# Patient Record
Sex: Female | Born: 1995 | Race: White | Hispanic: No | Marital: Single | Smoking: Never smoker
Health system: Southern US, Community
[De-identification: ages and names within clinical notes are randomized; demographics above are authoritative.]

---

## 2015-07-26 ENCOUNTER — Ambulatory Visit (INDEPENDENT_AMBULATORY_CARE_PROVIDER_SITE_OTHER): Payer: Managed Care, Other (non HMO) | Admitting: Family Medicine

## 2015-07-26 VITALS — BP 102/70 | HR 82 | Temp 98.6°F | Resp 16 | Ht 65.5 in | Wt 152.4 lb

## 2015-07-26 DIAGNOSIS — R05 Cough: Secondary | ICD-10-CM | POA: Diagnosis not present

## 2015-07-26 DIAGNOSIS — J029 Acute pharyngitis, unspecified: Secondary | ICD-10-CM

## 2015-07-26 DIAGNOSIS — R059 Cough, unspecified: Secondary | ICD-10-CM

## 2015-07-26 DIAGNOSIS — J208 Acute bronchitis due to other specified organisms: Secondary | ICD-10-CM

## 2015-07-26 LAB — POCT CBC
Granulocyte percent: 84.4 %G — AB (ref 37–80)
HCT, POC: 38.4 % (ref 37.7–47.9)
Hemoglobin: 13 g/dL (ref 12.2–16.2)
LYMPH, POC: 1.3 (ref 0.6–3.4)
MCH, POC: 29.5 pg (ref 27–31.2)
MCHC: 33.8 g/dL (ref 31.8–35.4)
MCV: 87.1 fL (ref 80–97)
MID (CBC): 0.7 (ref 0–0.9)
MPV: 6.8 fL (ref 0–99.8)
PLATELET COUNT, POC: 321 10*3/uL (ref 142–424)
POC Granulocyte: 11.1 — AB (ref 2–6.9)
POC LYMPH %: 10.1 % (ref 10–50)
POC MID %: 5.5 %M (ref 0–12)
RBC: 4.41 M/uL (ref 4.04–5.48)
RDW, POC: 13.1 %
WBC: 13.1 10*3/uL — AB (ref 4.6–10.2)

## 2015-07-26 LAB — POCT RAPID STREP A (OFFICE): Rapid Strep A Screen: NEGATIVE

## 2015-07-26 MED ORDER — AZITHROMYCIN 250 MG PO TABS: ORAL_TABLET | ORAL | Status: DC

## 2015-07-26 NOTE — Progress Notes (Signed)
[facility] [address], [place] [id number] [phone]  Date:  07/26/2015   Name:  Carla EkeJennifer Fatica   DOB:  [birth year]   MRN:  [id number]  PCP:  No primary care provider on file.    Chief Complaint: Nasal Congestion; Headache; swollen lymph node; and Sore Throat   History of Present Illness:  Carla Rodriguez is a 19 y.o. very pleasant female patient who presents with the following:  She notes onset of illness 2 days ago.  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   History reviewed. No pertinent past medical history.  History reviewed. No pertinent past surgical history.  Social History  Substance Use Topics  . Smoking status: Never Smoker   . Smokeless tobacco: None  . Alcohol Use: No    History reviewed. No pertinent family history.  No Known Allergies  Medication list has been reviewed and updated.  No current outpatient prescriptions on file prior to visit.   No current facility-administered medications on file prior to visit.    Review of Systems:  As per HPI- otherwise negative.   Physical Examination: Filed Vitals:   07/26/15 1247  BP: 102/70  Pulse: 82  Temp: 98.6 F (37 C)  Resp: 16   Filed Vitals:   07/26/15 1247  Height: 5' 5.5" (1.664 m)  Weight: 152 lb 6.4 oz (69.128 kg)   Body mass index is 24.97 kg/(m^2). Ideal Body Weight: Weight in (lb) to have BMI = 25: 152.2  GEN: WDWN, NAD, Non-toxic, A & O x 3, looks well HEENT: Atraumatic, Normocephalic. Neck supple. No masses.  Anterior cervical  LAD.  Bilateral TM wnl, oropharynx normal.  PEERL,EOMI.   Ears and Nose: No external deformity. CV: RRR, No M/G/R. No JVD. No thrill. No extra heart sounds. PULM: CTA B, no wheezes, crackles, rhonchi. No retractions. No resp. distress. No accessory muscle use. ABD: S, NT, ND, +BS. No rebound. No HSM. EXTR: No c/c/e NEURO Normal gait.  PSYCH: Normally interactive. Conversant. Not depressed or anxious appearing.  Calm demeanor.    Assessment and Plan: Acute bronchitis due to other specified organisms - Plan: azithromycin (ZITHROMAX) 250 MG tablet  Cough - Plan: POCT CBC  Acute pharyngitis, unspecified etiology - Plan: POCT CBC, POCT rapid strep A  Treat for bronchitis with leukocytosis with azithromcyin, supportive care She will let us know if not feeling better soon- Sooner if worse.   Discussed supportive care  Signed Abbe AmsterdamJessica Thadius Smisek, MD

## 2015-07-26 NOTE — Patient Instructions (Signed)
Use the azithromycin as directed for bronchitis- Use ibuprofen and/ or tylenol as needed for pain, fever and aches Rest and drink plenty of fluids Let me know if you do not feel better in the next few days

## 2016-07-26 ENCOUNTER — Emergency Department (HOSPITAL_COMMUNITY): Payer: Managed Care, Other (non HMO)

## 2016-07-26 ENCOUNTER — Emergency Department (HOSPITAL_COMMUNITY)
Admission: EM | Admit: 2016-07-26 | Discharge: 2016-07-26 | Disposition: A | Discharge status: Home / Self Care | Payer: Managed Care, Other (non HMO) | Attending: Emergency Medicine | Admitting: Emergency Medicine

## 2016-07-26 DIAGNOSIS — Z79899 Other long term (current) drug therapy: Secondary | ICD-10-CM | POA: Insufficient documentation

## 2016-07-26 DIAGNOSIS — R059 Cough, unspecified: Secondary | ICD-10-CM

## 2016-07-26 DIAGNOSIS — R042 Hemoptysis: Secondary | ICD-10-CM | POA: Diagnosis present

## 2016-07-26 DIAGNOSIS — R05 Cough: Secondary | ICD-10-CM | POA: Insufficient documentation

## 2016-07-26 LAB — URINALYSIS, ROUTINE W REFLEX MICROSCOPIC
Bilirubin Urine: NEGATIVE
GLUCOSE, UA: NEGATIVE mg/dL
HGB URINE DIPSTICK: NEGATIVE
Ketones, ur: NEGATIVE mg/dL
Nitrite: NEGATIVE
PH: 7 (ref 5.0–8.0)
Protein, ur: NEGATIVE mg/dL
SPECIFIC GRAVITY, URINE: 1.02 (ref 1.005–1.030)

## 2016-07-26 LAB — BASIC METABOLIC PANEL
ANION GAP: 8 (ref 5–15)
BUN: 9 mg/dL (ref 6–20)
CHLORIDE: 105 mmol/L (ref 101–111)
CO2: 24 mmol/L (ref 22–32)
CREATININE: 0.62 mg/dL (ref 0.44–1.00)
Calcium: 9.6 mg/dL (ref 8.9–10.3)
GFR calc non Af Amer: >60 mL/min (ref >60)
Glucose, Bld: 93 mg/dL (ref 65–99)
POTASSIUM: 4 mmol/L (ref 3.5–5.1)
Sodium: 137 mmol/L (ref 135–145)

## 2016-07-26 LAB — CBC
HEMATOCRIT: 41.2 % (ref 36.0–46.0)
HEMOGLOBIN: 13.7 g/dL (ref 12.0–15.0)
MCH: 29.6 pg (ref 26.0–34.0)
MCHC: 33.3 g/dL (ref 30.0–36.0)
MCV: 89 fL (ref 78.0–100.0)
Platelets: 394 10*3/uL (ref 150–400)
RBC: 4.63 MIL/uL (ref 3.87–5.11)
RDW: 13.2 % (ref 11.5–15.5)
WBC: 8.7 10*3/uL (ref 4.0–10.5)

## 2016-07-26 LAB — URINE MICROSCOPIC-ADD ON

## 2016-07-26 LAB — CBG MONITORING, ED: Glucose-Capillary: 88 mg/dL (ref 65–99)

## 2016-07-26 LAB — D-DIMER, QUANTITATIVE: D-Dimer, Quant: <0.27 ug/mL-FEU (ref 0.00–0.50)

## 2016-07-26 MED ORDER — ALBUTEROL SULFATE HFA 108 (90 BASE) MCG/ACT IN AERS
2.0000 | INHALATION_SPRAY | RESPIRATORY_TRACT | Status: DC | PRN
  Filled 2016-07-26: qty 6.7

## 2016-07-26 MED ORDER — AZITHROMYCIN 250 MG PO TABS: ORAL_TABLET | ORAL | 0 refills | Status: AC

## 2016-07-26 MED ORDER — PREDNISONE 20 MG PO TABS: 40.0000 mg | ORAL_TABLET | Freq: Every day | ORAL | 0 refills | Status: AC

## 2016-07-26 NOTE — ED Triage Notes (Signed)
Pt presents c/o productive cough w/ blood tinged sputum. Pt states [facility]. Pt reports unwitnessed syncope last night. Pt A+OX4, speaking in complete sentences.

## 2016-07-26 NOTE — Discharge Instructions (Signed)
Please read and follow all provided instructions.  Your diagnoses today include:  1. Cough     Tests performed today include:  Chest x-ray - does not show any pneumonia  Blood counts and electrolytes  Test for blood clot - negative  Vital signs. See below for your results today.   Medications prescribed:   Azithromycin - antibiotic for respiratory infection  You have been prescribed an antibiotic medicine: take the entire course of medicine even if you are feeling better. Stopping early can cause the antibiotic not to work.   Albuterol inhaler - medication that opens up your airway  Use inhaler as follows: 1-2 puffs with spacer every 4 hours as needed for wheezing, cough, or shortness of breath.    Prednisone - steroid medicine   It is best to take this medication in the morning to prevent sleeping problems. If you are diabetic, monitor your blood sugar closely and stop taking Prednisone if blood sugar is over 300. Take with food to prevent stomach upset.   Take any prescribed medications only as directed.  Home care instructions:  Follow any educational materials contained in this packet.  Follow-up instructions: Please follow-up with your primary care provider in the next 3 days for further evaluation of your symptoms and a recheck if you are not feeling better.   Return instructions:   Please return to the Emergency Department if you experience worsening symptoms.  Please return with worsening wheezing, shortness of breath, or difficulty breathing.  Return with persistent fever above 101F.   Please return if you have any other emergent concerns.  Additional Information:  Your vital signs today were: BP 127/90 (BP Location: Right Arm)    Pulse 76    Temp 99.2 F (37.3 C) (Oral)    Resp 18    Ht 5\' 5"  (1.651 m)    Wt 80.3 kg    LMP 06/22/2016    SpO2 100%    BMI 29.47 kg/m  If your blood pressure (BP) was elevated above 135/85 this visit, please have this  repeated by your doctor within one month. --------------

## 2016-07-26 NOTE — ED Provider Notes (Signed)
[facility]-EMERGENCY DEPT Provider Note   CSN: [id number] Arrival date & time: 07/26/16  1051     History   Chief Complaint Chief Complaint  Patient presents with  . Hemoptysis    HPI Carla Rodriguez is a 20 y.o. female.  Patient presents with complaint of cough for approximately 5 weeks. Patient had nasal congestion onset however this improved. She denies a persistent cough with production of sputum. She was seen by her primary care initially who gave her supportive care. Later which she was seen by the doctor at her [facility] [employer], had an x-ray and was treated for pneumonia and bronchitis. Patient states that she took a week of doxycycline and finished this but the symptoms have not improved. Patient had blood-tinged sputum this morning which is new. She complains of pain across her lower chest. Sometimes she feels short of breath when lying flat. She has had some muscle cramps in her legs and mild swelling bilaterally. Patient is on oral birth control. She is not a smoker. Patient denies other risk factors for pulmonary embolism including: unilateral leg swelling, history of DVT/PE/other blood clots,  recent immobilizations, recent surgery, recent travel (>4hr segment), malignancy, hemoptysis. Denies allergy symptoms, GERD symptoms, h/o asthma. She has some wheezing at times. The onset of this condition was acute. The course is persistent. Aggravating factors: none. Alleviating factors: none.         No past medical history on file.  There are no active problems to display for this patient.   No past surgical history on file.  OB History    No data available       Home Medications    Prior to Admission medications   Medication Sig Start Date End Date Taking? Authorizing Provider  acetaminophen (TYLENOL) 325 MG tablet Take 650 mg by mouth every 6 (six) hours as needed for moderate pain.   Yes Historical Provider, MD  amphetamine-dextroamphetamine (ADDERALL) 10 MG tablet Take  15 mg by mouth 2 (two) times daily as needed. Pt only takes when she goes to school.   Yes Historical Provider, MD  diphenhydrAMINE (BENADRYL) 25 MG tablet Take 25 mg by mouth every 6 (six) hours as needed for allergies.   Yes Historical Provider, MD  Norethindrone Acetate-Ethinyl Estrad-FE (LOMEDIA 24 FE) 1-20 MG-MCG(24) tablet Take 1 tablet by mouth daily.   Yes Historical Provider, MD  azithromycin (ZITHROMAX Z-PAK) 250 MG tablet Take two tablets PO on day 1 and one tablet PO days 2-5 07/26/16   Renne CriglerJoshua Carleah Yablonski, PA-C  predniSONE (DELTASONE) 20 MG tablet Take 2 tablets (40 mg total) by mouth daily. 07/26/16   Renne CriglerJoshua Khing Belcher, PA-C    Family History No family history on file.  Social History Social History  Substance Use Topics  . Smoking status: Never Smoker  . Smokeless tobacco: Not on file  . Alcohol use No     Allergies   Review of patient's allergies indicates no known allergies.   Review of Systems Review of Systems  Constitutional: Negative for fever.  HENT: Negative for rhinorrhea and sore throat.   Eyes: Negative for redness.  Respiratory: Positive for cough, shortness of breath and wheezing.   Cardiovascular: Positive for leg swelling. Negative for chest pain.  Gastrointestinal: Negative for abdominal pain, diarrhea, nausea and vomiting.  Genitourinary: Negative for dysuria.  Musculoskeletal: Negative for myalgias.  Skin: Negative for rash.  Neurological: Negative for headaches.     Physical Exam Updated Vital Signs BP 146/82 (BP Location: Left Arm)  Pulse 83   Temp 99.2 F (37.3 C) (Oral)   Resp 17   Ht 5\' 5"  (1.651 m)   Wt 80.3 kg   LMP 06/22/2016   SpO2 100%   BMI 29.47 kg/m   Physical Exam  Constitutional: She appears well-developed and well-nourished.  HENT:  Head: Normocephalic and atraumatic.  Right Ear: Tympanic membrane normal.  Left Ear: Tympanic membrane normal.  Nose: Nose normal. No mucosal edema.  Mouth/Throat: Uvula is midline and  oropharynx is clear and moist.  Eyes: Conjunctivae are normal. Right eye exhibits no discharge. Left eye exhibits no discharge.  Neck: Normal range of motion. Neck supple.  Cardiovascular: Normal rate, regular rhythm and normal heart sounds.   No murmur heard. Pulmonary/Chest: Effort normal and breath sounds normal. No respiratory distress. She has no wheezes. She has no rales.  Abdominal: Soft. There is no tenderness.  Musculoskeletal: She exhibits no edema.  No appreciable lower extremity edema on exam.   Neurological: She is alert.  Skin: Skin is warm and dry.  Psychiatric: She has a normal mood and affect.  Nursing note and vitals reviewed.    ED Treatments / Results  Labs (all labs ordered are listed, but only abnormal results are displayed) Labs Reviewed  URINALYSIS, ROUTINE W REFLEX MICROSCOPIC (NOT AT [facility]) - Abnormal; Notable for the following:       Result Value   APPearance CLOUDY (*)    Leukocytes, UA MODERATE (*)    All other components within normal limits  URINE MICROSCOPIC-ADD ON - Abnormal; Notable for the following:    Squamous Epithelial / LPF 6-30 (*)    Bacteria, UA RARE (*)    All other components within normal limits  BASIC METABOLIC PANEL  CBC  D-DIMER, QUANTITATIVE (NOT AT [facility])  CBG MONITORING, ED    Radiology Dg Chest 2 View  Result Date: 07/26/2016 CLINICAL DATA:  Nonproductive cough EXAM: CHEST  2 VIEW COMPARISON:  None. FINDINGS: The heart size and mediastinal contours are within normal limits. Both lungs are clear. The visualized skeletal structures are unremarkable. IMPRESSION: No active cardiopulmonary disease. Electronically Signed   By: Alcide Clever M.D.   On: 07/26/2016 13:42    Procedures Procedures (including critical care time)  Medications Ordered in ED Medications  albuterol (PROVENTIL HFA;VENTOLIN HFA) 108 (90 Base) MCG/ACT inhaler 2 puff (not administered)     Initial Impression / Assessment and Plan / ED Course  I have  reviewed the triage vital signs and the nursing notes.  Pertinent labs & imaging results that were available during my care of the patient were reviewed by me and considered in my medical decision making (see chart for details).  Clinical Course   Patient seen and examined. Reviewed work-up to this point. Discussed d-dimer rule out PE. Patient agrees to proceed.  Vital signs reviewed and are as follows: BP 146/82 (BP Location: Left Arm)   Pulse 83   Temp 99.2 F (37.3 C) (Oral)   Resp 17   Ht 5\' 5"  (1.651 m)   Wt 80.3 kg   LMP 06/22/2016   SpO2 100%   BMI 29.47 kg/m   D-dimer is negative. Will discharge to home with albuterol inhaler, azithromycin, prednisone.  Encouraged patient to follow-up with her primary care physician for recheck. Return to the emergency department with worsening symptoms, trouble breathing, fever, new symptoms or other concerns.    Final Clinical Impressions(s) / ED Diagnoses   Final diagnoses:  Cough   Patient with persistent  cough, normal x-ray, negative d-dimer. Will continue to treat supportively.  New Prescriptions Discharge Medication List as of 07/26/2016  2:56 PM    START taking these medications   Details  predniSONE (DELTASONE) 20 MG tablet Take 2 tablets (40 mg total) by mouth daily., Starting Sun 07/26/2016, Print         Renne Crigler, PA-C 07/26/16 1525    Nelva Nay, MD 08/01/16 [phone]

## 2017-08-01 ENCOUNTER — Emergency Department (HOSPITAL_COMMUNITY): Payer: Managed Care, Other (non HMO)

## 2017-08-01 ENCOUNTER — Encounter (HOSPITAL_COMMUNITY): Payer: Self-pay | Admitting: Nurse Practitioner

## 2017-08-01 ENCOUNTER — Emergency Department (HOSPITAL_COMMUNITY)
Admission: EM | Admit: 2017-08-01 | Discharge: 2017-08-02 | Disposition: A | Discharge status: Home / Self Care | Payer: Managed Care, Other (non HMO) | Attending: Emergency Medicine | Admitting: Emergency Medicine

## 2017-08-01 DIAGNOSIS — R350 Frequency of micturition: Secondary | ICD-10-CM | POA: Diagnosis present

## 2017-08-01 DIAGNOSIS — Z79899 Other long term (current) drug therapy: Secondary | ICD-10-CM | POA: Diagnosis not present

## 2017-08-01 DIAGNOSIS — M545 Low back pain, unspecified: Secondary | ICD-10-CM

## 2017-08-01 LAB — BASIC METABOLIC PANEL
ANION GAP: 10 (ref 5–15)
BUN: 16 mg/dL (ref 6–20)
CHLORIDE: 105 mmol/L (ref 101–111)
CO2: 25 mmol/L (ref 22–32)
Calcium: 9.8 mg/dL (ref 8.9–10.3)
Creatinine, Ser: 0.63 mg/dL (ref 0.44–1.00)
GFR calc Af Amer: >60 mL/min (ref >60)
GFR calc non Af Amer: >60 mL/min (ref >60)
Glucose, Bld: 100 mg/dL — ABNORMAL HIGH (ref 65–99)
POTASSIUM: 3.8 mmol/L (ref 3.5–5.1)
Sodium: 140 mmol/L (ref 135–145)

## 2017-08-01 LAB — URINALYSIS, ROUTINE W REFLEX MICROSCOPIC
Bilirubin Urine: NEGATIVE
Glucose, UA: NEGATIVE mg/dL
Hgb urine dipstick: NEGATIVE
Ketones, ur: NEGATIVE mg/dL
LEUKOCYTES UA: NEGATIVE
NITRITE: NEGATIVE
PH: 6 (ref 5.0–8.0)
Protein, ur: NEGATIVE mg/dL
SPECIFIC GRAVITY, URINE: 1.003 — AB (ref 1.005–1.030)

## 2017-08-01 LAB — CBC
HEMATOCRIT: 39.7 % (ref 36.0–46.0)
HEMOGLOBIN: 13.6 g/dL (ref 12.0–15.0)
MCH: 30.2 pg (ref 26.0–34.0)
MCHC: 34.3 g/dL (ref 30.0–36.0)
MCV: 88 fL (ref 78.0–100.0)
Platelets: 377 10*3/uL (ref 150–400)
RBC: 4.51 MIL/uL (ref 3.87–5.11)
RDW: 13.2 % (ref 11.5–15.5)
WBC: 9.7 10*3/uL (ref 4.0–10.5)

## 2017-08-01 LAB — WET PREP, GENITAL
Clue Cells Wet Prep HPF POC: NONE SEEN
Sperm: NONE SEEN
Trich, Wet Prep: NONE SEEN
Yeast Wet Prep HPF POC: NONE SEEN

## 2017-08-01 LAB — POC URINE PREG, ED: Preg Test, Ur: NEGATIVE

## 2017-08-01 MED ORDER — CAMPHOR-MENTHOL-METHYL SAL 1.2-5.7-6.3 % EX PTCH: 1.0000 | MEDICATED_PATCH | Freq: Every day | CUTANEOUS | 0 refills | Status: AC

## 2017-08-01 MED ORDER — ACETAMINOPHEN 325 MG PO TABS
650.0000 mg | ORAL_TABLET | Freq: Once | ORAL | Status: AC
  Administered 2017-08-01: 650 mg via ORAL
  Filled 2017-08-01: qty 2

## 2017-08-01 MED ORDER — POLYETHYLENE GLYCOL 3350 17 GM/SCOOP PO POWD: 17.0000 g | Freq: Every day | ORAL | 0 refills | Status: AC

## 2017-08-01 NOTE — Discharge Instructions (Signed)
Your diagnoses today include:  1. Bilateral low back pain without sciatica, unspecified chronicity    About diagnosis. Most episodes of acute low back pain are self-limited. Your exam was reassuring today that the source of your pain is not affecting the spinal cord and nerves that originate in the spinal cord.  It is also reassuring that we have not found pelvic problems for your pain. 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   Tests performed today include: See side panel of your discharge paperwork for testing performed today. Vital signs are listed at the bottom of these instructions.   -Urine testing -Transvaginal ultrasound -Electrolytes and kidney function -Blood counts -Wet prep, gonorrhea, and chlamydia  These are all reassuring.  Medications prescribed:    Take any prescribed medications only as prescribed, and any over the counter medications only as directed on the packaging.  Miralax  SalonPAS for back discomfort.  You are prescribed ibuprofen, a non-steroidal anti-inflammatory agent (NSAID) for pain. You may take 600mg  every 6 hours as needed for pain. If still requiring this medication around the clock for acute pain after 10 days, please see your primary healthcare provider.  You may combine this medication with Tylenol, 650 mg every 6 hours, so you are receiving something for pain every 3 hours.  This is not a long-term medication unless under the care and direction of your primary provider. Taking this medication long-term and not under the supervision of a healthcare provider could increase the risk of stomach ulcers, kidney problems, and cardiovascular problems such as high blood pressure.    Home care instructions:   Low back pain gets worse the longer you stay stationary. Please keep moving and walking as tolerated. There are exercises included in this packet to perform as tolerated for your low back pain.   Apply heat to the areas  that are painful. Avoid twisting or bending your trunk to lift something. Do not lift anything above 25 lbs while recovering from this flare of low back pain.  Please follow any educational materials contained in this packet.   Follow-up instructions: Please follow-up with your primary care provider in this week for further evaluation of your symptoms if they are not completely improved.   Please follow up with endocrinology and gynecology as you previously planned.  Return instructions:  Please return to the Emergency Department if you experience worsening symptoms.  Please return for any fever or chills in the setting of your back pain, weakness in the muscles of the legs, numbness in your legs and feet that is new or changing, numbness in the area where you wipe, retention of your urine, loss of bowel or bladder control, or problems with walking. Please return for any worsening lower abdominal pain, vaginal bleeding or you are soaking through 1 pad an hour for at least 6 hours, blood in stool, fever or chill in the setting of your symptoms. Please return if you have any other emergent concerns.  Additional Information:   Your vital signs today were: BP 134/88 (BP Location: Left Arm)    Pulse 92    Temp 98.4 F (36.9 C) (Oral)    Resp 20    LMP 07/30/2017    SpO2 97%  If your blood pressure (BP) was elevated on multiple readings during this visit above 130 for the top number or above 80 for the bottom number, please have this repeated by your primary care provider within one month. --------------  Thank  you for allowing us to participate in your care today.

## 2017-08-01 NOTE — ED Provider Notes (Signed)
[facility]-EMERGENCY DEPT Provider Note   CSN: [id number] Arrival date & time: 08/01/17  1440     History   Chief Complaint Chief Complaint  Patient presents with  . Urinary Frequency    HPI Carla Rodriguez is a 21 y.o. female.  HPI   Patient is a 21 year old female with an abdominal surgical history of appendectomy presenting for 24 hours of bilateral low back pain and lower abdominal pain.  Patient reports that the pain in her low back is been approximately 8 out of 10 in severity at its worst.  Pain is worst with movement. Patient reports that she had some nausea today and vomited 3 times.  The pain she is having is somewhat consistent with the pain she gets prior to her menstrual cycle with low back pain and pelvic cramping.  Patient denies any fever, chills, diarrhea, vaginal discharge, or abnormal vaginal bleeding.  Patient reports she has had urinary frequency but no pain with urination.  Patient reports last bowel movement was today and normal for patient.  Patient has a history of constipation, however she does not take stool softeners.  She typically has bowel movement 2-3 times a week.  Patient is sexually active with one partner and reports that she has low concern for STI at this time.  Last menstrual period 2 weeks ago.  No saddle anesthesia no loss of bowel or bladder control no lower extremity weakness or numbness, no IVDU.  History reviewed. No pertinent past medical history.  There are no active problems to display for this patient.   History reviewed. No pertinent surgical history.  OB History    No data available       Home Medications    Prior to Admission medications   Medication Sig Start Date End Date Taking? Authorizing Provider  acetaminophen (TYLENOL) 325 MG tablet Take 650 mg by mouth every 6 (six) hours as needed for moderate pain.   Yes [provider]  amphetamine-dextroamphetamine (ADDERALL) 10 MG tablet Take 15  mg by mouth 2 (two) times daily as needed. Pt only takes when she goes to school.   Yes [provider]  Norethindrone Acetate-Ethinyl Estrad-FE (LOMEDIA 24 FE) 1-20 MG-MCG(24) tablet Take 1 tablet by mouth daily.   Yes [provider]  psyllium (REGULOID) 0.52 g capsule Take 0.52 g by mouth daily as needed (bowel).   Yes [provider]  azithromycin (ZITHROMAX Z-PAK) 250 MG tablet Take two tablets PO on day 1 and one tablet PO days 2-5 Patient not taking: Reported on 08/01/2017 07/26/16   Renne Crigler, PA-C  Camphor-Menthol-Methyl Sal 1.2-5.7-6.3 % PTCH Apply 1 patch topically daily. 08/01/17   Aviva Kluver B, PA-C  polyethylene glycol powder (MIRALAX) powder Take 17 g by mouth daily. 08/01/17   Aviva Kluver B, PA-C  predniSONE (DELTASONE) 20 MG tablet Take 2 tablets (40 mg total) by mouth daily. Patient not taking: Reported on 08/01/2017 07/26/16   Renne Crigler, PA-C    Family History No family history on file.  Social History Social History  Substance Use Topics  . Smoking status: Never Smoker  . Smokeless tobacco: Not on file  . Alcohol use No     Allergies   Other   Review of Systems Review of Systems  Constitutional: Negative for chills and fever.  HENT: Negative for congestion and sore throat.   Eyes: Negative for visual disturbance.  Respiratory: Negative for cough, chest tightness and shortness of breath.   Cardiovascular: Negative  for chest pain.  Gastrointestinal: Positive for abdominal pain, nausea and vomiting. Negative for constipation and diarrhea.  Genitourinary: Positive for frequency and pelvic pain. Negative for dysuria, vaginal bleeding and vaginal discharge.  Musculoskeletal: Positive for back pain. Negative for myalgias.  Skin: Negative for rash.  Neurological: Positive for light-headedness and headaches. Negative for dizziness and syncope.     Physical Exam Updated Vital Signs BP 125/76 (BP Location: Left Arm)    Pulse 74   Temp 98.4 F (36.9 C) (Oral)   Resp 20   LMP 07/30/2017   SpO2 97%   Physical Exam  Constitutional: She appears well-developed and well-nourished. No distress.  HENT:  Head: Normocephalic and atraumatic.  Mouth/Throat: Oropharynx is clear and moist.  Eyes: Pupils are equal, round, and reactive to light. Conjunctivae and EOM are normal.  Neck: Normal range of motion. Neck supple.  Cardiovascular: Normal rate, regular rhythm, S1 normal and S2 normal.   No murmur heard. Pulmonary/Chest: Effort normal and breath sounds normal. She has no wheezes. She has no rales.  Abdominal: Soft. She exhibits no distension. There is no tenderness. There is no guarding.  Genitourinary:  Genitourinary Comments: Exam performed with nurse chaperone present.  No external lesions of perineum or vulva.  Discomfort to palpation of right and left adnexa as well as over the uterine fundus, but more on right.  No cervical motion tenderness.  Speculum exam demonstrates no erythema or lesions of the vaginal wall.  No erythema of cervix.  No abnormal vaginal discharge.  Small amount of vaginal bleeding brown in color.  Musculoskeletal: Normal range of motion. She exhibits no edema or deformity.  Spine Exam: Inspection/Palpation: Tenderness to palpation of bilateral lumbar musculature.  No midline tenderness to palpation of cervical, thoracic, or lumbar region. Strength: 5/5 throughout LE bilaterally (hip flexion/extension, adduction/abduction; knee flexion/extension; foot dorsiflexion/plantarflexion, inversion/eversion; great toe inversion) Sensation: Intact to light touch in proximal and distal LE bilaterally Reflexes: 2+ quadriceps and achilles reflexes  Lymphadenopathy:    She has no cervical adenopathy.  Neurological: She is alert.  Cranial nerves grossly intact. Patient is extremities with good coordination and symmetrically.  Skin: Skin is warm and dry. No rash noted. No erythema.  Psychiatric: She  has a normal mood and affect. Her behavior is normal. Judgment and thought content normal.  Nursing note and vitals reviewed.    ED Treatments / Results  Labs (all labs ordered are listed, but only abnormal results are displayed) Labs Reviewed  WET PREP, GENITAL - Abnormal; Notable for the following:       Result Value   WBC, Wet Prep HPF POC FEW (*)    All other components within normal limits  URINALYSIS, ROUTINE W REFLEX MICROSCOPIC - Abnormal; Notable for the following:    Color, Urine COLORLESS (*)    Specific Gravity, Urine 1.003 (*)    All other components within normal limits  BASIC METABOLIC PANEL - Abnormal; Notable for the following:    Glucose, Bld 100 (*)    All other components within normal limits  CBC  POC URINE PREG, ED  GC/CHLAMYDIA PROBE AMP ([place]) NOT AT [facility]    EKG  EKG Interpretation None       Radiology US Transvaginal Non-ob  Result Date: 08/01/2017 CLINICAL DATA:  Right lower quadrant pain EXAM: TRANSABDOMINAL AND TRANSVAGINAL ULTRASOUND OF PELVIS DOPPLER ULTRASOUND OF OVARIES TECHNIQUE: Both transabdominal and transvaginal ultrasound examinations of the pelvis were performed. Transabdominal technique was performed for global imaging of the  pelvis including uterus, ovaries, adnexal regions, and pelvic cul-de-sac. It was necessary to proceed with endovaginal exam following the transabdominal exam to visualize the bilateral ovaries. Color and duplex Doppler ultrasound was utilized to evaluate blood flow to the ovaries. COMPARISON:  None. FINDINGS: Uterus Measurements: 6.3 x 2.8 x 3.7 cm. Small nabothian cyst. Endometrium Thickness: 3.9 mm.  No focal abnormality visualized. Right ovary Measurements: 3 x 2 x 1.8 cm. Normal appearance/no adnexal mass. Left ovary Measurements: 2.3 x 1.8 x 1.9 cm. Normal appearance/no adnexal mass. Pulsed Doppler evaluation of both ovaries demonstrates normal low-resistance arterial and venous waveforms. Other findings No  abnormal free fluid. IMPRESSION: Negative for ovarian torsion or mass lesion. Electronically Signed   By: Jasmine Pang M.D.   On: 08/01/2017 22:33   US Pelvis Complete  Result Date: 08/01/2017 CLINICAL DATA:  Right lower quadrant pain EXAM: TRANSABDOMINAL AND TRANSVAGINAL ULTRASOUND OF PELVIS DOPPLER ULTRASOUND OF OVARIES TECHNIQUE: Both transabdominal and transvaginal ultrasound examinations of the pelvis were performed. Transabdominal technique was performed for global imaging of the pelvis including uterus, ovaries, adnexal regions, and pelvic cul-de-sac. It was necessary to proceed with endovaginal exam following the transabdominal exam to visualize the bilateral ovaries. Color and duplex Doppler ultrasound was utilized to evaluate blood flow to the ovaries. COMPARISON:  None. FINDINGS: Uterus Measurements: 6.3 x 2.8 x 3.7 cm. Small nabothian cyst. Endometrium Thickness: 3.9 mm.  No focal abnormality visualized. Right ovary Measurements: 3 x 2 x 1.8 cm. Normal appearance/no adnexal mass. Left ovary Measurements: 2.3 x 1.8 x 1.9 cm. Normal appearance/no adnexal mass. Pulsed Doppler evaluation of both ovaries demonstrates normal low-resistance arterial and venous waveforms. Other findings No abnormal free fluid. IMPRESSION: Negative for ovarian torsion or mass lesion. Electronically Signed   By: Jasmine Pang M.D.   On: 08/01/2017 22:33   [place] Art/ven Flow Abd Pelv Doppler  Result Date: 08/01/2017 CLINICAL DATA:  Right lower quadrant pain EXAM: TRANSABDOMINAL AND TRANSVAGINAL ULTRASOUND OF PELVIS DOPPLER ULTRASOUND OF OVARIES TECHNIQUE: Both transabdominal and transvaginal ultrasound examinations of the pelvis were performed. Transabdominal technique was performed for global imaging of the pelvis including uterus, ovaries, adnexal regions, and pelvic cul-de-sac. It was necessary to proceed with endovaginal exam following the transabdominal exam to visualize the bilateral ovaries. Color and duplex Doppler  ultrasound was utilized to evaluate blood flow to the ovaries. COMPARISON:  None. FINDINGS: Uterus Measurements: 6.3 x 2.8 x 3.7 cm. Small nabothian cyst. Endometrium Thickness: 3.9 mm.  No focal abnormality visualized. Right ovary Measurements: 3 x 2 x 1.8 cm. Normal appearance/no adnexal mass. Left ovary Measurements: 2.3 x 1.8 x 1.9 cm. Normal appearance/no adnexal mass. Pulsed Doppler evaluation of both ovaries demonstrates normal low-resistance arterial and venous waveforms. Other findings No abnormal free fluid. IMPRESSION: Negative for ovarian torsion or mass lesion. Electronically Signed   By: Jasmine Pang M.D.   On: 08/01/2017 22:33    Procedures Procedures (including critical care time)  Medications Ordered in ED Medications  acetaminophen (TYLENOL) tablet 650 mg (650 mg Oral Given 08/01/17 2128)     Initial Impression / Assessment and Plan / ED Course  I have reviewed the triage vital signs and the nursing notes.  Pertinent labs & imaging results that were available during my care of the patient were reviewed by me and considered in my medical decision making (see chart for details).    Final Clinical Impressions(s) / ED Diagnoses   Final diagnoses:  Bilateral low back pain without sciatica, unspecified chronicity  MDM  Patient is well-appearing, afebrile, and in no acute distress.  Patient denies any concerning symptoms suggestive of acute abdominal or spinal pathology at this time. Exam demonstrated no  weakness on exam today. No preceding injury or trauma to suggest acute fracture of spine. Doubt urinary pathology for patient's acute back pain, as patient has no evidence of infection on UA, has no CVA tenderness, history/pain not consistent with nephrolithiasis. Doubt pelvic pathologies as patient has no vaginal discharge, and is not pregnant as evidenced by negative POC urine pregnancy test, TVUS demonstrated no torsion, cysts, or free fluid suggestive of acute cyst rupture.   Lab work demonstrates no leukocytosis or other abnormality.  This patient has a history of constipation, suspect that she may have an element of this at this time, and should initiate MiraLAX.  Additionally given the paraspinal lumbar muscular tenderness, do suspect that there may be an element of muscle strain.  The bilateral nature of abdominal and lower back symptoms are reassuring.  Patient given strict return precautions for any symptoms indicating worsening neurologic function in the lower extremities, or fever in setting of symptoms, worsening pain, blood in stool, or heavy vaginal bleeding.  Patient is in understanding and agrees with the plan of care.  This is a supervised visit with Dr. Loren Raceravid Yelverton. Evaluation, management, and discharge planning discussed with this attending physician.  New Prescriptions Discharge Medication List as of 08/01/2017 11:13 PM    START taking these medications   Details  Camphor-Menthol-Methyl Sal 1.2-5.7-6.3 % PTCH Apply 1 patch topically daily., Starting Sun 08/01/2017, Print    polyethylene glycol powder (MIRALAX) powder Take 17 g by mouth daily., Starting Sun 08/01/2017, Print         Aviva KluverMurray, Klaira Pesci B, PA-C 08/02/17 0138    Elisha PonderMurray, Beckie Viscardi B, PA-C 08/02/17 0139    Loren RacerYelverton, David, MD 08/02/17 [phone]

## 2017-08-01 NOTE — ED Triage Notes (Signed)
Pt is c/o urinary frequency accompanied by lower back pain/CVA pain. Reports hx of kidney infections adding the symptoms are similar.

## 2017-08-02 LAB — GC/CHLAMYDIA PROBE AMP (~~LOC~~) NOT AT ARMC
Chlamydia: NEGATIVE
Neisseria Gonorrhea: NEGATIVE

## 2017-11-17 IMAGING — CR DG CHEST 2V
2 series · 2 of 2 positions shown · non-contrast
Comparison: None.

CLINICAL DATA: Nonproductive cough

EXAM:
CHEST  2 VIEW

[w chest pa]
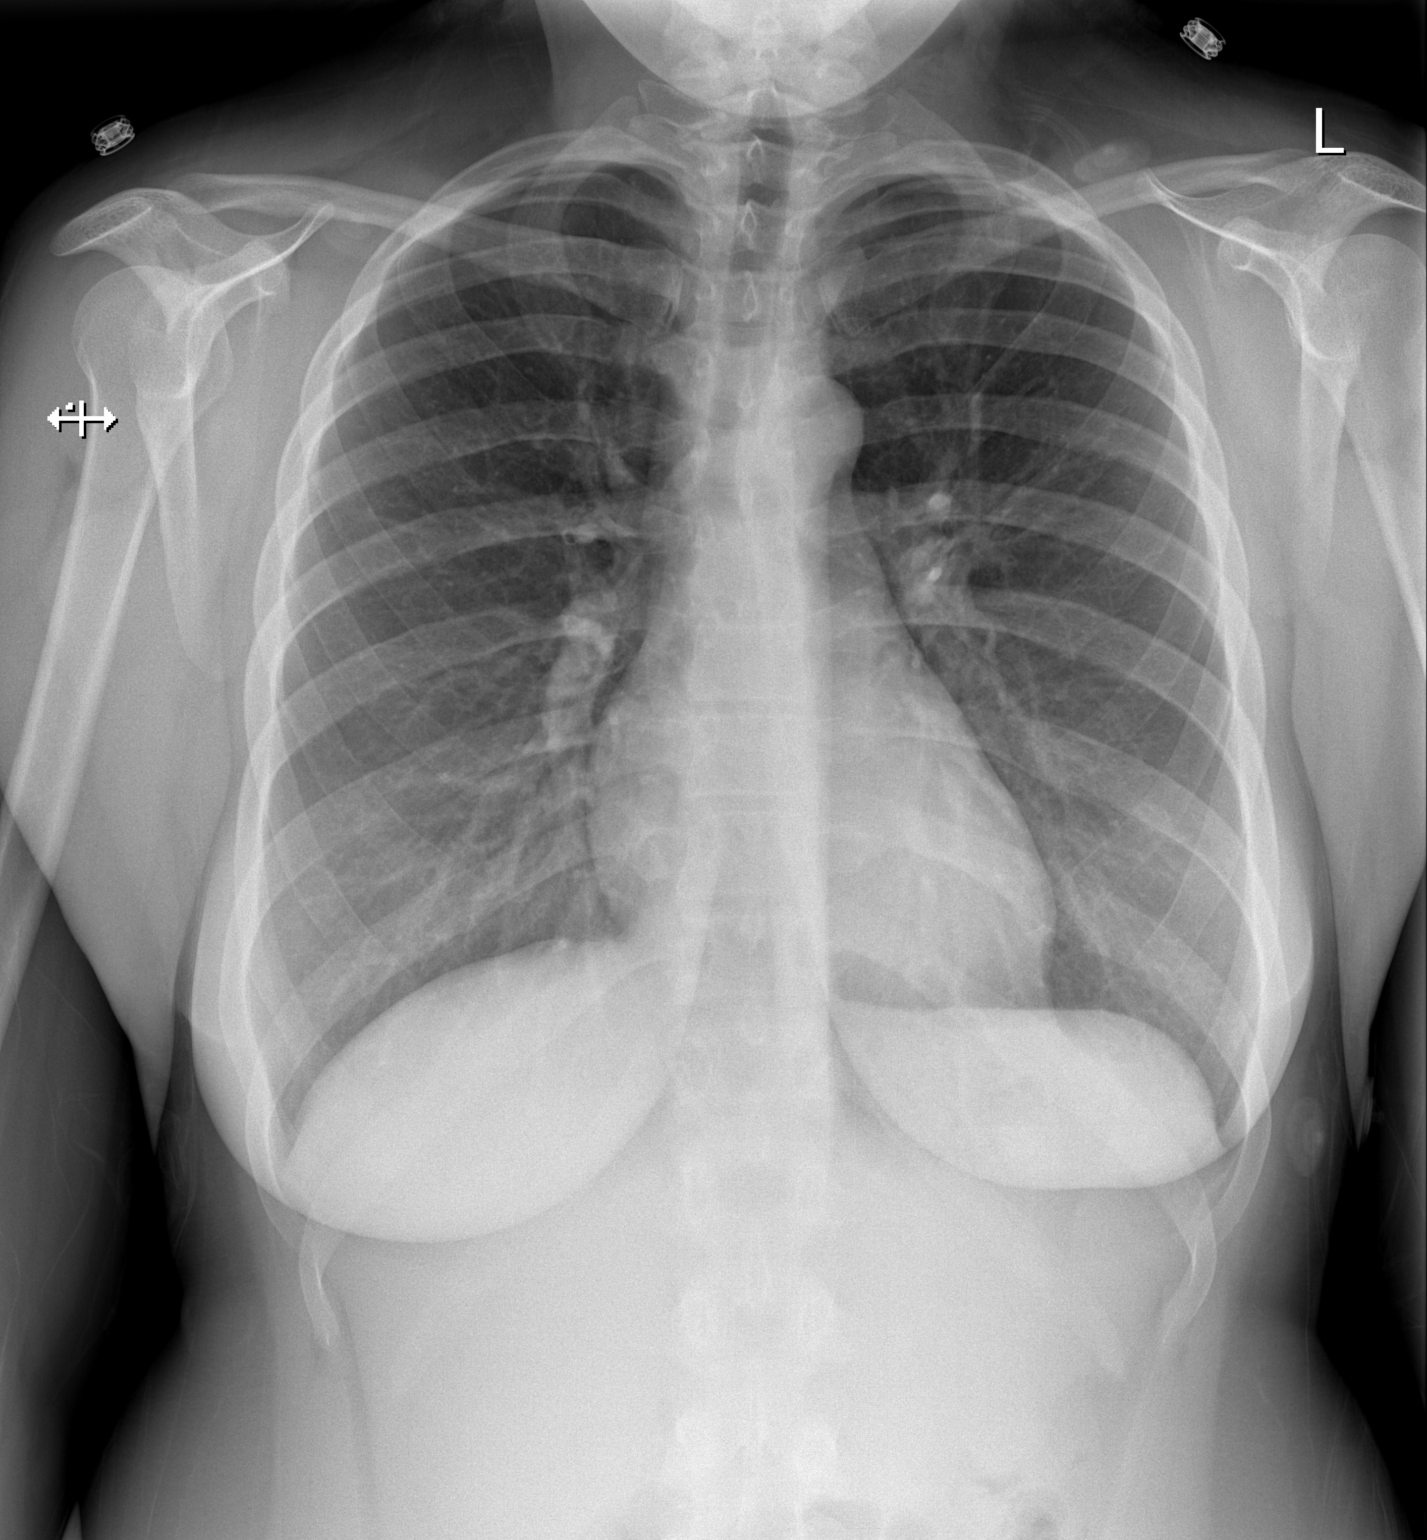

[w chest lat]
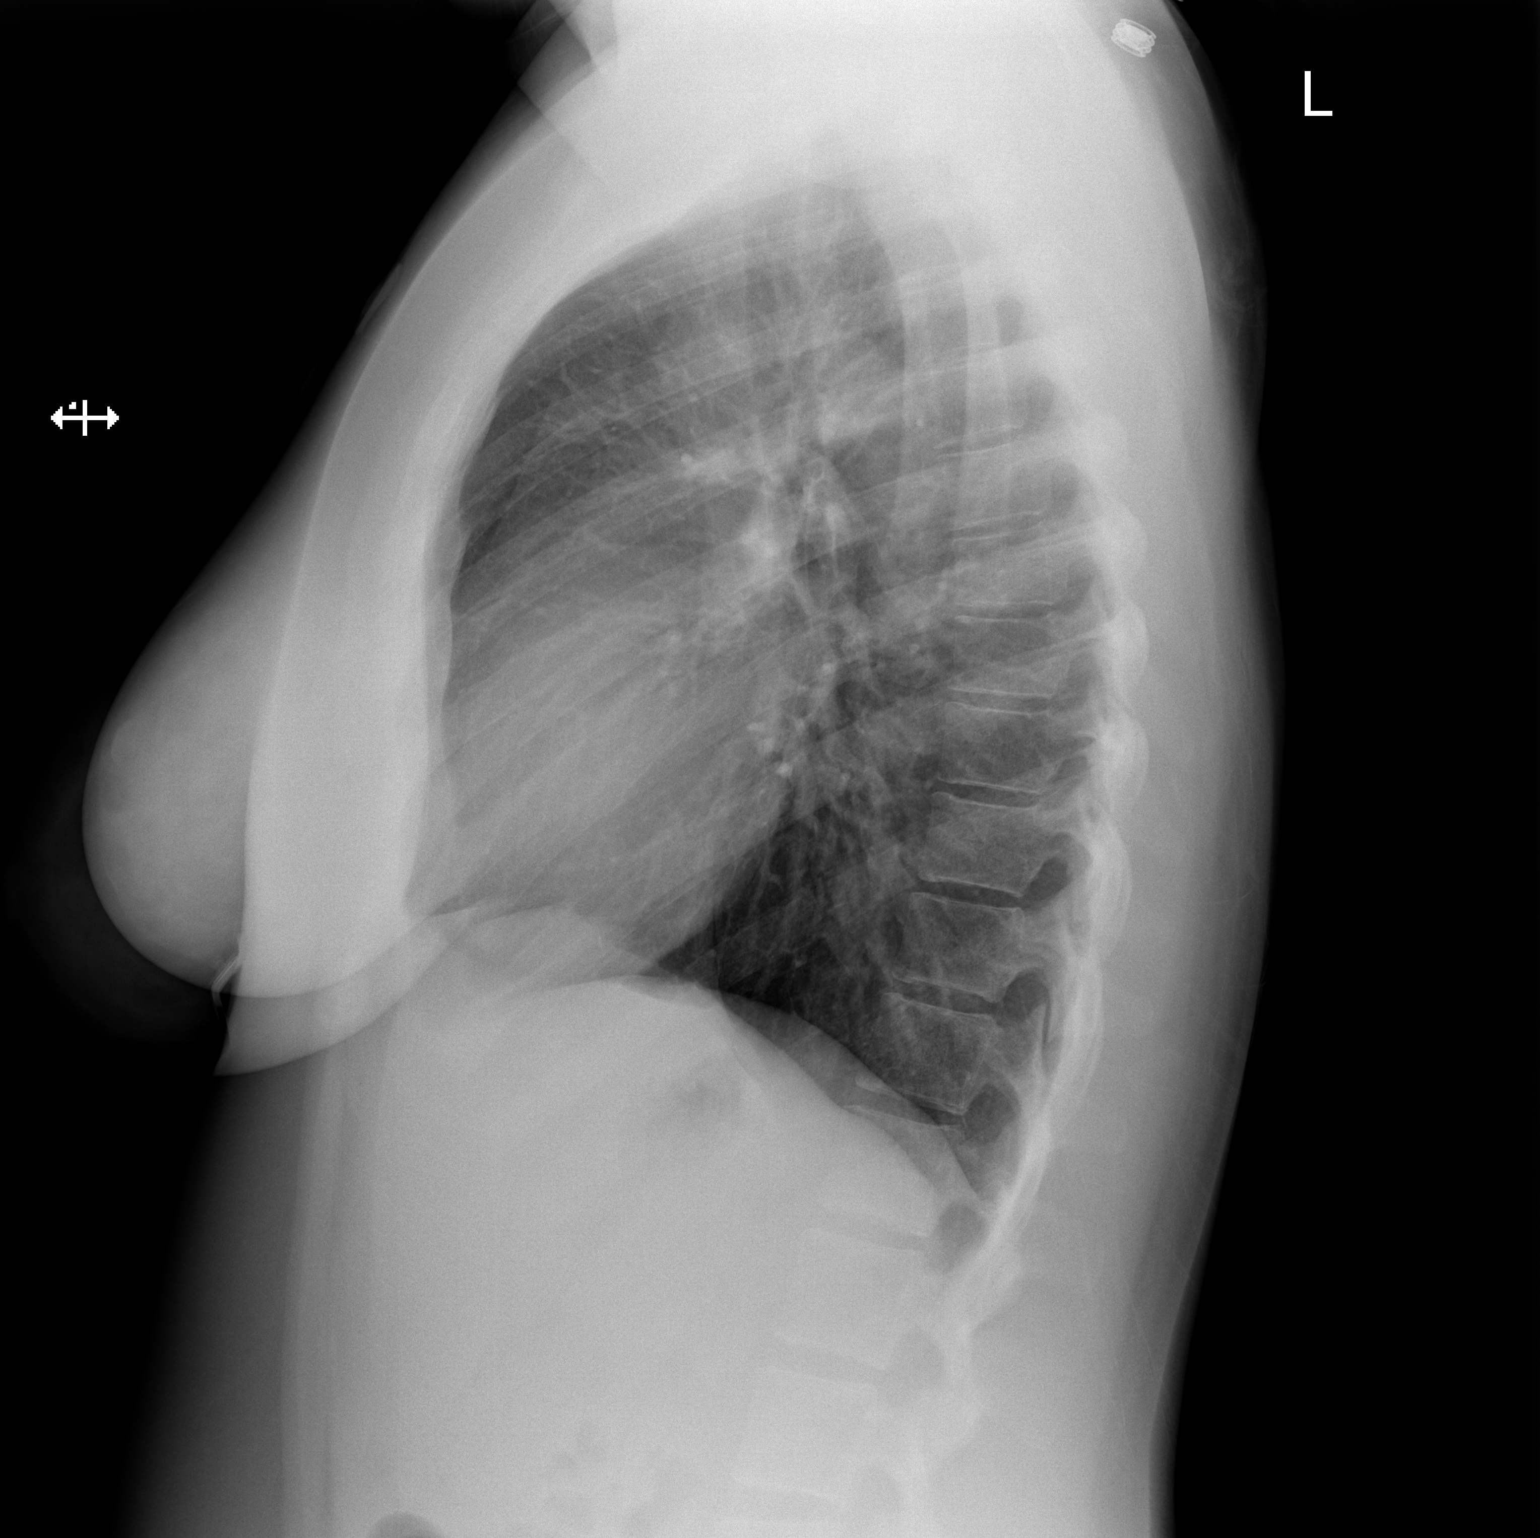

[2 of 2 positions shown; findings below may reference images not displayed]

FINDINGS: The heart size and mediastinal contours are within normal limits.
Both lungs are clear. The visualized skeletal structures are
unremarkable.
IMPRESSION: No active cardiopulmonary disease.

## 2019-06-27 IMAGING — US US PELVIS COMPLETE
1 series · 14 of 25 positions shown · non-contrast
Comparison: None.

CLINICAL DATA: Right lower quadrant pain

EXAM:
TRANSABDOMINAL AND TRANSVAGINAL ULTRASOUND OF PELVIS
DOPPLER ULTRASOUND OF OVARIES
TECHNIQUE: Both transabdominal and transvaginal ultrasound examinations of the
pelvis were performed. Transabdominal technique was performed for
global imaging of the pelvis including uterus, ovaries, adnexal
regions, and pelvic cul-de-sac.
It was necessary to proceed with endovaginal exam following the
transabdominal exam to visualize the bilateral ovaries. Color and
duplex Doppler ultrasound was utilized to evaluate blood flow to the
ovaries.

[Series 1: us pelvis complete · 0.25mm/px · 56 acquisitions, 14 frames shown]
[im 1/56]
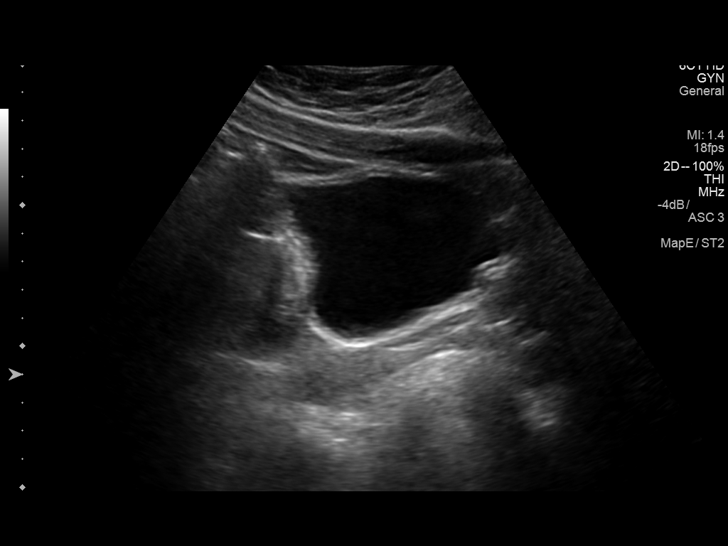
[im 5/56]
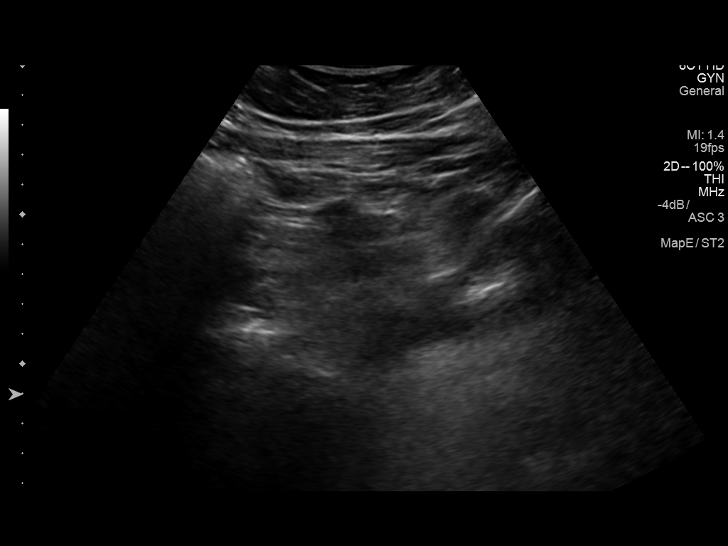
[im 10/56]
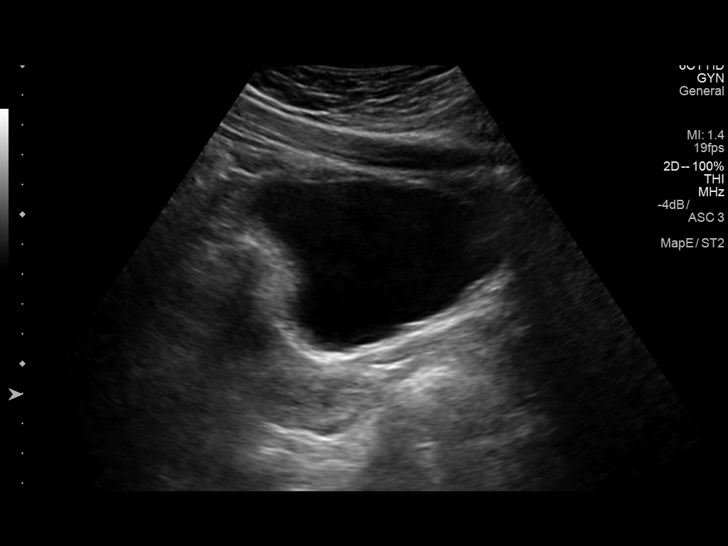
[im 14/56]
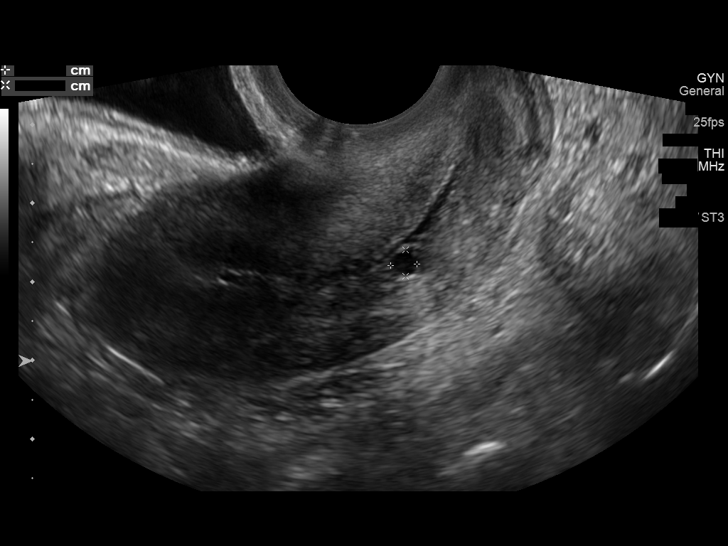
[im 19/56]
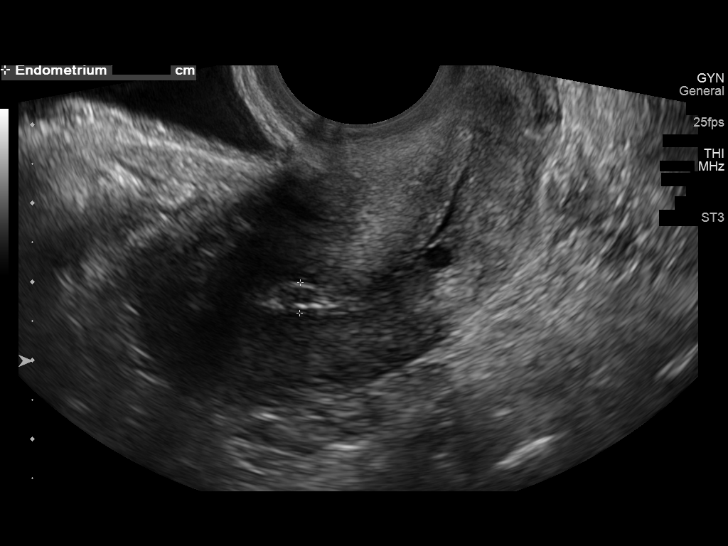
[im 21/56]
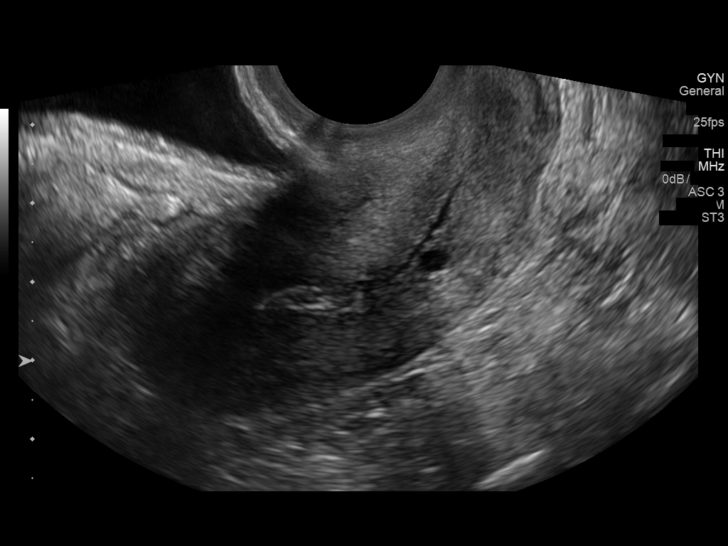
[im 26/56]
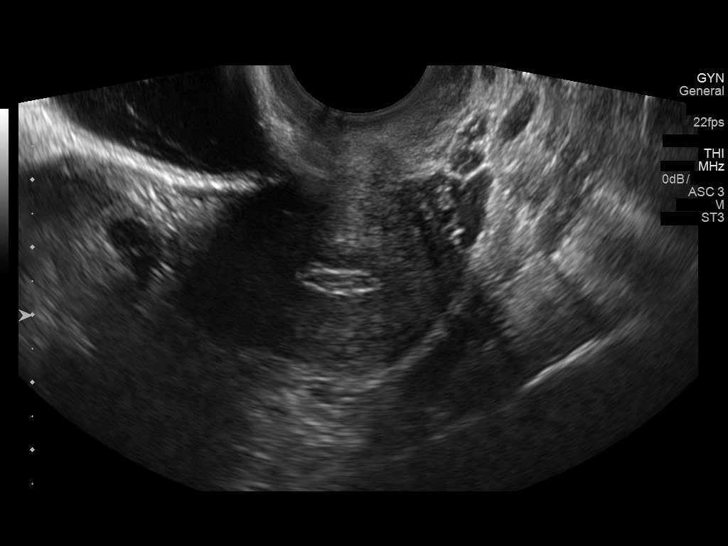
[im 30/56]
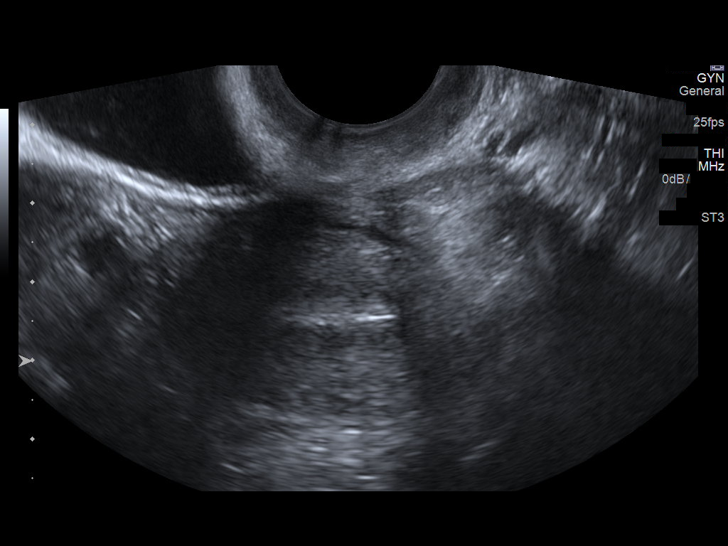
[im 35/56]
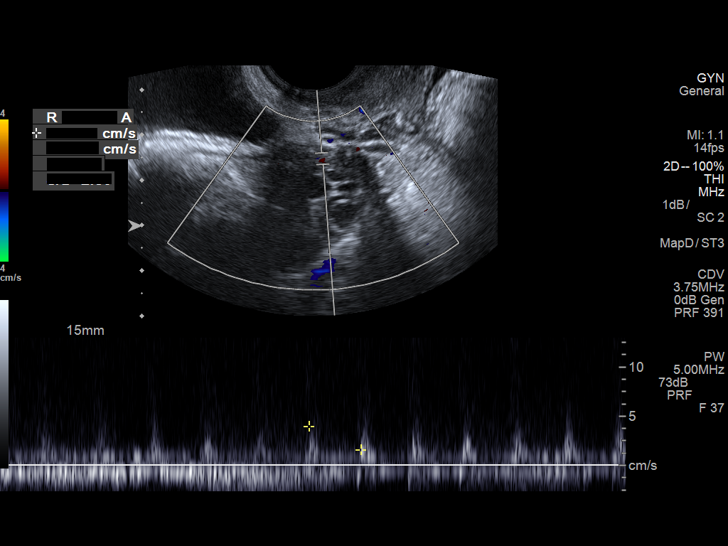
[im 37/56]
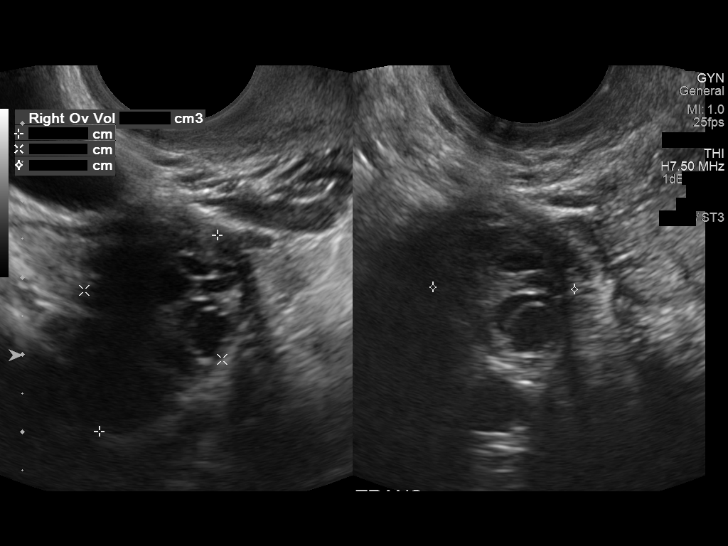
[im 42/56]
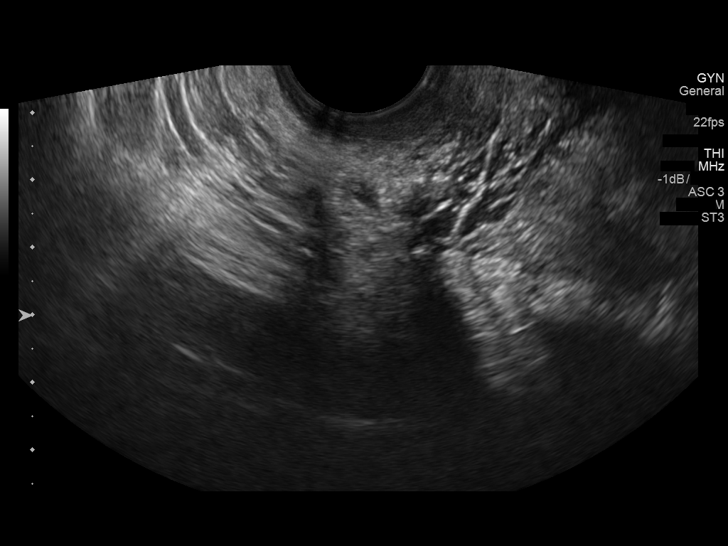
[im 46/56]
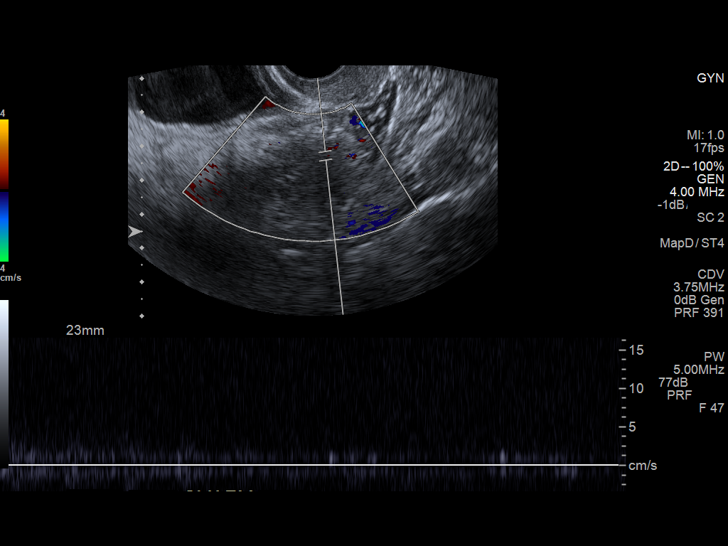
[im 51/56]
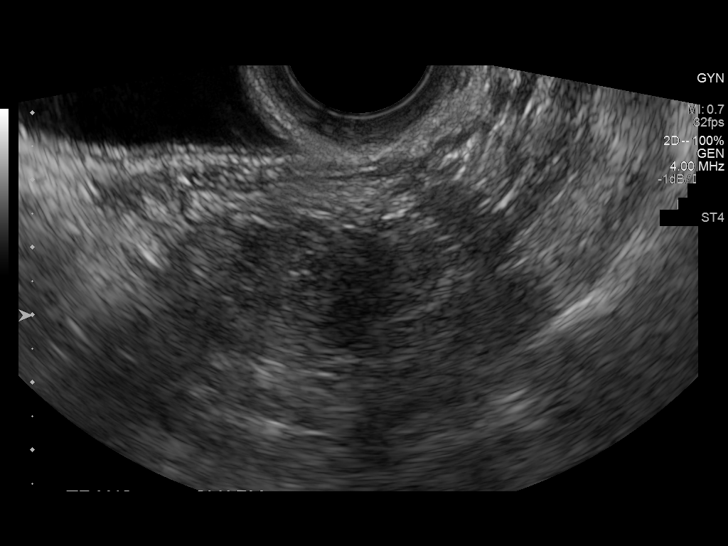
[im 56/56]
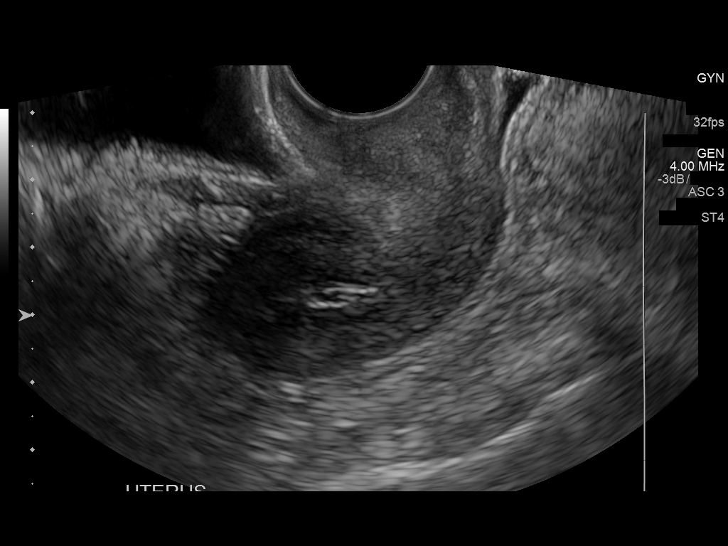

[14 of 25 positions shown; findings below may reference images not displayed]

FINDINGS: Uterus

Measurements: 6.3 x 2.8 x 3.7 cm. Small nabothian cyst.

Endometrium

Thickness: 3.9 mm.  No focal abnormality visualized.

Right ovary

Measurements: 3 x 2 x 1.8 cm. Normal appearance/no adnexal mass.

Left ovary

Measurements: 2.3 x 1.8 x 1.9 cm. Normal appearance/no adnexal mass.

Pulsed Doppler evaluation of both ovaries demonstrates normal
low-resistance arterial and venous waveforms.

Other findings

No abnormal free fluid.
IMPRESSION: Negative for ovarian torsion or mass lesion.
